# Patient Record
Sex: Male | Born: 1990 | Race: Black or African American | Hispanic: No | Marital: Married | Smoking: Never smoker
Health system: Southern US, Community
[De-identification: ages and names within clinical notes are randomized; demographics above are authoritative.]

---

## 2016-03-19 ENCOUNTER — Emergency Department
Admission: EM | Admit: 2016-03-19 | Discharge: 2016-03-19 | Disposition: A | Discharge status: Home / Self Care | Payer: Medicaid Other | Attending: Emergency Medicine | Admitting: Emergency Medicine

## 2016-03-19 ENCOUNTER — Emergency Department: Payer: Medicaid Other

## 2016-03-19 DIAGNOSIS — G4750 Parasomnia, unspecified: Secondary | ICD-10-CM | POA: Diagnosis present

## 2016-03-19 LAB — COMPREHENSIVE METABOLIC PANEL
ALBUMIN: 4.4 g/dL (ref 3.5–5.0)
ALK PHOS: 67 U/L (ref 38–126)
ALT: 16 U/L — AB (ref 17–63)
AST: 20 U/L (ref 15–41)
Anion gap: 2 — ABNORMAL LOW (ref 5–15)
BUN: 14 mg/dL (ref 6–20)
CALCIUM: 9.1 mg/dL (ref 8.9–10.3)
CHLORIDE: 106 mmol/L (ref 101–111)
CO2: 29 mmol/L (ref 22–32)
CREATININE: 1 mg/dL (ref 0.61–1.24)
GFR calc non Af Amer: >60 mL/min (ref >60)
GLUCOSE: 103 mg/dL — AB (ref 65–99)
Potassium: 3.4 mmol/L — ABNORMAL LOW (ref 3.5–5.1)
SODIUM: 137 mmol/L (ref 135–145)
Total Bilirubin: 1 mg/dL (ref 0.3–1.2)
Total Protein: 7.8 g/dL (ref 6.5–8.1)

## 2016-03-19 LAB — CBC
HCT: 42.8 % (ref 40.0–52.0)
HEMOGLOBIN: 14.5 g/dL (ref 13.0–18.0)
MCH: 31.9 pg (ref 26.0–34.0)
MCHC: 33.9 g/dL (ref 32.0–36.0)
MCV: 94 fL (ref 80.0–100.0)
PLATELETS: 212 10*3/uL (ref 150–440)
RBC: 4.55 MIL/uL (ref 4.40–5.90)
RDW: 12.7 % (ref 11.5–14.5)
WBC: 9.6 10*3/uL (ref 3.8–10.6)

## 2016-03-19 LAB — TROPONIN I: Troponin I: <0.03 ng/mL (ref <0.031)

## 2016-03-19 NOTE — ED Notes (Signed)
Reviewed d/c instructions and follow-up care with pt. Pt verbalized understanding 

## 2016-03-19 NOTE — ED Provider Notes (Signed)
[facility] Emergency Department Provider Note  ____________________________________________   I have reviewed the triage vital signs and the nursing notes.   HISTORY  Chief Complaint I can't sleep well   HPI Corey Erickson is a 25 y.o. male presents withthe above complaint. Patient states that he has difficulty falling asleep and when he does he sometimes jerks awake bilaterally. When this happens, he states he has a leaf fleeting moment of lightheadedness, he states he feels like he might pass out but that he doesn't. He feels this is attributable to stress. He states he is under more stress at work. He actually to me denies any chest pain at any other time unless he is jerking out of sleep which time he sometimes is a fleeting discomfort in his chest. He states it happen right before he came in and that's why he came to the emergency room because he started having. This happened several times a night. He has no exertional symptoms, he has no nausea or vomiting. The pain is usually described as a slight discomfort from jerking he states. Sometimes his foot will jerk. He states he has not had any actual seizures. He has not had any incontinence of bowel or bladder. He is conscious and awake when this happens. He is a happens at that moment that he is just about to fall asleep. It makes him anxious and that he is worried about trying to fall sleep thereafter. He states that he was supposed to follow up with a cardiologist for this but missed the appointment. He does not have any pain at this time. This has been going on on a daily basis for several months and he finally got medicaid & wants to get it checked out. To me, he initially denied any chest pain but was only after pressing and I discussed with them the triage note that he states that sometimes there is chest discomfort associated with this but the primary complaint that he has his own he tries to fall asleep he gets  anxious and has a spasmodic wakefulness.  No past medical history on file.  There are no active problems to display for this patient.   No past surgical history on file.  No current outpatient prescriptions on file.  Allergies Review of patient's allergies indicates no known allergies.  No family history on file.  Social History Social History  Substance Use Topics  . Smoking status: Not on file  . Smokeless tobacco: Not on file  . Alcohol Use: Not on file    Review of Systems Constitutional: No fever/chills Eyes: No visual changes. ENT: No sore throat. No stiff neck no neck pain Cardiovascular: See history of present illness Respiratory: Denies shortness of breath. Gastrointestinal:   no vomiting.  No diarrhea.  No constipation. Genitourinary: Negative for dysuria. Musculoskeletal: Negative lower extremity swelling Skin: Negative for rash. Neurological: Negative for headaches, focal weakness or numbness. 10-point ROS otherwise negative.  ____________________________________________   PHYSICAL EXAM:  VITAL SIGNS: ED Triage Vitals  Enc Vitals Group     BP 03/19/16 0050 163/101 mmHg     Pulse Rate 03/19/16 0050 83     Resp 03/19/16 0050 18     Temp 03/19/16 0050 98.1 F (36.7 C)     Temp Source 03/19/16 0050 Oral     SpO2 03/19/16 0050 98 %     Weight 03/19/16 0050 243 lb (110.224 kg)     Height 03/19/16 0050 6\' 2"  (1.88 m)  Head Cir --      Peak Flow --      Pain Score 03/19/16 0048 6     Pain Loc --      Pain Edu? --      Excl. in GC? --     Constitutional: Alert and oriented. Well appearing and in no acute distress. Eyes: Conjunctivae are normal. PERRL. EOMI. Head: Atraumatic. Nose: No congestion/rhinnorhea. Mouth/Throat: Mucous membranes are moist.  Oropharynx non-erythematous. Neck: No stridor.   Nontender with no meningismus Cardiovascular: Normal rate, regular rhythm. Grossly normal heart sounds.  Good peripheral circulation. Respiratory:  Normal respiratory effort.  No retractions. Lungs CTAB. Abdominal: Soft and nontender. No distention. No guarding no rebound Back:  There is no focal tenderness or step off there is no midline tenderness there are no lesions noted. there is no CVA tenderness Musculoskeletal: No lower extremity tenderness. No joint effusions, no DVT signs strong distal pulses no edema Neurologic:  Normal speech and language. No gross focal neurologic deficits are appreciated.  Skin:  Skin is warm, dry and intact. No rash noted. Psychiatric: Mood and affect are normal. Speech and behavior are normal.  ____________________________________________   LABS (all labs ordered are listed, but only abnormal results are displayed)  Labs Reviewed  COMPREHENSIVE METABOLIC PANEL - Abnormal; Notable for the following:    Potassium 3.4 (*)    Glucose, Bld 103 (*)    ALT 16 (*)    Anion gap 2 (*)    All other components within normal limits  CBC  TROPONIN I   ____________________________________________  EKG  I personally interpreted any EKGs ordered by me or triage Normal sinus rhythm at 76 bpm no acute ST elevation or acute ST depression normal axis ____________________________________________  RADIOLOGY  I reviewed any imaging ordered by me or triage that were performed during my shift and, if possible, patient and/or family made aware of any abnormal findings. ____________________________________________   PROCEDURES  Procedure(s) performed: None  Critical Care performed: None  ____________________________________________   INITIAL IMPRESSION / ASSESSMENT AND PLAN / ED COURSE  Pertinent labs & imaging results that were available during my care of the patient were reviewed by me and considered in my medical decision making (see chart for details).  Patient with a very unusual symptom of essentially parasomnia, he has symptoms he states to me only when he is falling asleep. He denied that happens  while he is driving. He's never had a seizure. He does not lose consciousness. Infected the opposite, when he tries to go to sleep he states he jerks awake rapidly it is disconcerting to him and he feels unwell for a few seconds. This is happened to him multiple times tonight for the last several months. He has no neurologic deficits at this time. There is no evidence of acute ongoing ACS PE or dissection. His symptoms do not suggest myocarditis endocarditis pericarditis pneumonia or pneumothorax. There is no evidence of any significant seizure activity. In any event, this is somewhat of a difficult to quantify parasomnia reaction at this time there is no evidence of acute pathology. We will have him follow closely as an outpatient for his already scheduled cardiology visit although I do not think this is likely mediated by any Cardiologic event, he does already have an appointment. We'll also have him follow-up with the primary care doctor which I think we'll be more helpful to him. I suggested that he try to reduce his stress. Patient denies cocaine, or significant caffeine  abuse or significant alcohol did advise him to exercise, extensive follow-up given and understood ____________________________________________   FINAL CLINICAL IMPRESSION(S) / ED DIAGNOSES  Final diagnoses:  None      This chart was dictated using voice recognition software.  Despite best efforts to proofread,  errors can occur which can change meaning.     Jeanmarie Plant, MD 03/19/16 [phone]

## 2016-03-19 NOTE — ED Notes (Signed)
MD McShane at bedside  

## 2016-03-19 NOTE — ED Notes (Signed)
Pt states that he has been having chest pain for the past month, states was referred to a cardiologist but missed the appointment, pt states that he started having chest tightness tonight while attempting to fall asleep, pt states each time he starts to fall asleep his chest tightens up and his body jerks him back awake. Pt denies ever having a sleep study in the past

## 2016-03-19 NOTE — Discharge Instructions (Signed)
Return to the emergency room if you have a seizure, you bite your tongue or lose consciousness, return to the emergency room if you have significant chest pain shortness of breath nausea vomiting or you feel worse in any way. Follow closely with your primary care doctor or the one to which we refer you, for further evaluation of this condition that you've had for the last few months.

## 2016-03-19 NOTE — ED Notes (Signed)
Pt reports medial chest pressure X 2 months. Pt currently rates pain/pressure at 5 out of 10.  Pt states: "Immediately when I fall asleep my body jolts me back out of my sleep, and I feel kinda lightheaded and weak. Sometimes it also happens when I'm driving". Pt reports this symptom has been going on for 1 month and has been keeping him from getting rest. Pt denies N/V, weakness, SOB, diaphoresis.

## 2016-05-31 ENCOUNTER — Encounter: Payer: Self-pay | Admitting: *Deleted

## 2016-05-31 ENCOUNTER — Emergency Department
Admission: EM | Admit: 2016-05-31 | Discharge: 2016-05-31 | Disposition: A | Discharge status: Home / Self Care | Payer: Medicaid Other | Attending: Emergency Medicine | Admitting: Emergency Medicine

## 2016-05-31 DIAGNOSIS — R42 Dizziness and giddiness: Secondary | ICD-10-CM

## 2016-05-31 LAB — CBC
HCT: 42.2 % (ref 40.0–52.0)
Hemoglobin: 14.3 g/dL (ref 13.0–18.0)
MCH: 31.5 pg (ref 26.0–34.0)
MCHC: 33.9 g/dL (ref 32.0–36.0)
MCV: 92.9 fL (ref 80.0–100.0)
PLATELETS: 182 10*3/uL (ref 150–440)
RBC: 4.54 MIL/uL (ref 4.40–5.90)
RDW: 12.6 % (ref 11.5–14.5)
WBC: 5.9 10*3/uL (ref 3.8–10.6)

## 2016-05-31 LAB — COMPREHENSIVE METABOLIC PANEL
ALBUMIN: 4.3 g/dL (ref 3.5–5.0)
ALK PHOS: 49 U/L (ref 38–126)
ALT: 16 U/L — ABNORMAL LOW (ref 17–63)
ANION GAP: 6 (ref 5–15)
AST: 23 U/L (ref 15–41)
BUN: 11 mg/dL (ref 6–20)
CALCIUM: 9.1 mg/dL (ref 8.9–10.3)
CHLORIDE: 104 mmol/L (ref 101–111)
CO2: 29 mmol/L (ref 22–32)
Creatinine, Ser: 1.03 mg/dL (ref 0.61–1.24)
GFR calc non Af Amer: >60 mL/min (ref >60)
GLUCOSE: 110 mg/dL — AB (ref 65–99)
POTASSIUM: 3.8 mmol/L (ref 3.5–5.1)
SODIUM: 139 mmol/L (ref 135–145)
Total Bilirubin: 0.9 mg/dL (ref 0.3–1.2)
Total Protein: 7.4 g/dL (ref 6.5–8.1)

## 2016-05-31 LAB — TROPONIN I: Troponin I: <0.03 ng/mL (ref <0.031)

## 2016-05-31 MED ORDER — SODIUM CHLORIDE 0.9 % IV SOLN
1000.0000 mL | Freq: Once | INTRAVENOUS | Status: AC
  Administered 2016-05-31: 1000 mL via INTRAVENOUS

## 2016-05-31 NOTE — Discharge Instructions (Signed)

## 2016-05-31 NOTE — ED Notes (Signed)
Pt complains of dizziness starting yesterday, pt reports similar episode in the past diagnosed as anxiety, pt reports" My heart fluttered yesterday"

## 2016-05-31 NOTE — ED Provider Notes (Signed)
[facility] Emergency Department Provider Note  ____________________________________________    I have reviewed the triage vital signs and the nursing notes.   HISTORY  Chief Complaint Dizziness    HPI Corey Erickson is a 25 y.o. male who presents with dizziness. Patient reports that he felt lightheaded yesterday and again today at work. He was sent home from work so he decided to come to the emergency department to be examined. He denies chest pain. He denies shortness of breath. No drug use. Does not smoke. He does report that yesterday his heart felt like it was fluttering briefly but not today. No history of arrhythmias. No recent travel. No calf pain.     History reviewed. No pertinent past medical history.  There are no active problems to display for this patient.   History reviewed. No pertinent past surgical history.  No current outpatient prescriptions on file.  Allergies Review of patient's allergies indicates no known allergies.  No family history on file.  Social History Social History  Substance Use Topics  . Smoking status: Never Smoker   . Smokeless tobacco: None  . Alcohol Use: Yes    Review of Systems  Constitutional: Negative for fever. Eyes: Negative for redness ENT: Negative for sore throat Cardiovascular: Negative for chest pain Respiratory: Negative for shortness of breath. Gastrointestinal: Negative for abdominal pain Genitourinary: Negative for dysuria. Musculoskeletal: Negative for back pain. Skin: Negative for rash. Neurological: Negative for focal weakness, No headache Psychiatric: no anxiety    ____________________________________________   PHYSICAL EXAM:  VITAL SIGNS:   ED Triage Vitals  Enc Vitals Group     BP --      Pulse --      Resp --      Temp --      Temp src --      SpO2 --      Weight --      Height --      Head Cir --      Peak Flow --      Pain Score --      Pain Loc --    Pain Edu? --      Excl. in GC? --      Constitutional: Alert and oriented. Well appearing and in no distress.  Eyes: Conjunctivae are normal. No erythema or injection ENT   Head: Normocephalic and atraumatic.   Mouth/Throat: Mucous membranes are moist. Cardiovascular: Normal rate, regular rhythm. Normal and symmetric distal pulses are present in the upper extremities. No murmurs or rubs  Respiratory: Normal respiratory effort without tachypnea nor retractions. Breath sounds are clear and equal bilaterally.  Gastrointestinal: Soft and non-tender in all quadrants. No distention. There is no CVA tenderness. Genitourinary: deferred Musculoskeletal: Nontender with normal range of motion in all extremities. No lower extremity tenderness nor edema. Neurologic:  Normal speech and language. No gross focal neurologic deficits are appreciated. Skin:  Skin is warm, dry and intact. No rash noted. Psychiatric: Mood and affect are normal. Patient exhibits appropriate insight and judgment.  ____________________________________________    LABS (pertinent positives/negatives)  Labs Reviewed  CBC  COMPREHENSIVE METABOLIC PANEL  TROPONIN I    ____________________________________________   EKG  ED ECG REPORT I, Jene Every, the attending physician, personally viewed and interpreted this ECG.  Date: 05/31/2016 EKG Time: 8:30 AM Rate: 89 Rhythm: normal sinus rhythm QRS Axis: normal Intervals: normal ST/T Wave abnormalities: normal Conduction Disturbances: none   ____________________________________________    RADIOLOGY  None  ____________________________________________   PROCEDURES  Procedure(s) performed: none  Critical Care performed:none  ____________________________________________   INITIAL IMPRESSION / ASSESSMENT AND PLAN / ED COURSE  Pertinent labs & imaging results that were available during my care of the patient were reviewed by me and considered in  my medical decision making (see chart for details).  Patient well-appearing and in no distress. He complains of mild dizziness. No history of arrhythmia. Normal heart rate today. We will check labs, give IV fluids and reevaluate.  ----------------------------------------- 9:29 AM on 05/31/2016 -----------------------------------------  Patient feels well. He is asymptomatic currently. His lab work is unremarkable. He is well-appearing and nontoxic and quite comfortable. History of present illness an exam not consistent with ACS, myocarditis, peritonitis, PE. Feel he is safe for discharge at this time with PCP follow-up.  ____________________________________________   FINAL CLINICAL IMPRESSION(S) / ED DIAGNOSES  Final diagnoses:  Dizziness          Jene Everyobert Marquis Diles, MD 05/31/16 0930

## 2016-07-05 ENCOUNTER — Emergency Department: Payer: Medicaid Other

## 2016-07-05 ENCOUNTER — Emergency Department
Admission: EM | Admit: 2016-07-05 | Discharge: 2016-07-05 | Disposition: A | Discharge status: Home / Self Care | Payer: Medicaid Other | Attending: Emergency Medicine | Admitting: Emergency Medicine

## 2016-07-05 DIAGNOSIS — R079 Chest pain, unspecified: Secondary | ICD-10-CM

## 2016-07-05 DIAGNOSIS — R0789 Other chest pain: Secondary | ICD-10-CM | POA: Diagnosis not present

## 2016-07-05 LAB — CBC
HCT: 45 % (ref 40.0–52.0)
Hemoglobin: 15.2 g/dL (ref 13.0–18.0)
MCH: 31.6 pg (ref 26.0–34.0)
MCHC: 33.7 g/dL (ref 32.0–36.0)
MCV: 93.6 fL (ref 80.0–100.0)
Platelets: 204 10*3/uL (ref 150–440)
RBC: 4.81 MIL/uL (ref 4.40–5.90)
RDW: 12.6 % (ref 11.5–14.5)
WBC: 7.4 10*3/uL (ref 3.8–10.6)

## 2016-07-05 LAB — BASIC METABOLIC PANEL
Anion gap: 7 (ref 5–15)
BUN: 13 mg/dL (ref 6–20)
CALCIUM: 9.5 mg/dL (ref 8.9–10.3)
CHLORIDE: 103 mmol/L (ref 101–111)
CO2: 29 mmol/L (ref 22–32)
CREATININE: 1.17 mg/dL (ref 0.61–1.24)
GFR calc non Af Amer: >60 mL/min (ref >60)
Glucose, Bld: 122 mg/dL — ABNORMAL HIGH (ref 65–99)
Potassium: 3.8 mmol/L (ref 3.5–5.1)
SODIUM: 139 mmol/L (ref 135–145)

## 2016-07-05 LAB — TROPONIN I

## 2016-07-05 MED ORDER — KETOROLAC TROMETHAMINE 30 MG/ML IJ SOLN
15.0000 mg | Freq: Once | INTRAMUSCULAR | Status: AC
  Administered 2016-07-05: 15 mg via INTRAVENOUS

## 2016-07-05 MED ORDER — KETOROLAC TROMETHAMINE 30 MG/ML IJ SOLN
INTRAMUSCULAR | Status: AC
  Administered 2016-07-05: 15 mg via INTRAVENOUS
  Filled 2016-07-05: qty 1

## 2016-07-05 MED ORDER — IOPAMIDOL (ISOVUE-370) INJECTION 76%
100.0000 mL | Freq: Once | INTRAVENOUS | Status: AC | PRN
  Administered 2016-07-05: 100 mL via INTRAVENOUS

## 2016-07-05 NOTE — ED Notes (Signed)
Patient transported to CT 

## 2016-07-05 NOTE — ED Notes (Signed)
Pt c/o pain in the left lower lateral rib that radiates into the left side of chest and into the left arm and jaw that started last night, denies N/V.Corey Erickson. States worse with deep breathing..Corey Erickson

## 2016-07-05 NOTE — ED Provider Notes (Signed)
Time Seen: Approximately 1410  I have reviewed the triage notes  Chief Complaint: Chest Pain   History of Present Illness: Corey Erickson is a 25 y.o. male who presents with left-sided chest discomfort with periodic radiation up into the left arm and left side of his neck. He states his pain started last night. He states is worse with deep inspiration and occasionally with movement. He denies any chest wall trauma. Patient has no pulmonary emboli were cardiovascular risk factors   History reviewed. No pertinent past medical history.  There are no active problems to display for this patient.   History reviewed. No pertinent past surgical history.  History reviewed. No pertinent past surgical history.  No current outpatient prescriptions on file.  Allergies:  Review of patient's allergies indicates no known allergies.  Family History: No family history on file.  Social History: Social History  Substance Use Topics  . Smoking status: Never Smoker   . Smokeless tobacco: None  . Alcohol Use: Yes     Review of Systems:   10 point review of systems was performed and was otherwise negative:  Constitutional: No fever Eyes: No visual disturbances ENT: No sore throat, ear pain Cardiac: Constant occasionally sharp discomfort Respiratory: No shortness of breath, wheezing, or stridor Abdomen: No abdominal pain, no vomiting, No diarrhea Endocrine: No weight loss, No night sweats Extremities: No peripheral edema, cyanosis Skin: No rashes, easy bruising Neurologic: No focal weakness, trouble with speech or swollowing Urologic: No dysuria, Hematuria, or urinary frequency Pain is also exacerbated by movement *  Physical Exam:  ED Triage Vitals  Enc Vitals Group     BP 07/05/16 1327 132/83 mmHg     Pulse Rate 07/05/16 1327 80     Resp 07/05/16 1327 18     Temp 07/05/16 1327 98.2 F (36.8 C)     Temp Source 07/05/16 1327 Oral     SpO2 07/05/16 1327 98 %     Weight  07/05/16 1327 242 lb (109.77 kg)     Height 07/05/16 1327 6\' 2"  (1.88 m)     Head Cir --      Peak Flow --      Pain Score 07/05/16 1328 8     Pain Loc --      Pain Edu? --      Excl. in GC? --     General: Awake , Alert , and Oriented times 3; GCS 15 Head: Normal cephalic , atraumatic Eyes: Pupils equal , round, reactive to light Nose/Throat: No nasal drainage, patent upper airway without erythema or exudate.  Neck: Supple, Full range of motion, No anterior adenopathy or palpable thyroid masses Lungs: Clear to ascultation without wheezes , rhonchi, or rales Heart: Regular rate, regular rhythm without murmurs , gallops , or rubs Abdomen: Soft, non tender without rebound, guarding , or rigidity; bowel sounds positive and symmetric in all 4 quadrants. No organomegaly .        Extremities: 2 plus symmetric pulses. No edema, clubbing or cyanosis Neurologic: normal ambulation, Motor symmetric without deficits, sensory intact Skin: warm, dry, no rashes Mild reproducible nature to his pain with movement at the left shoulder and left upper extremity  Labs:   All laboratory work was reviewed including any pertinent negatives or positives listed below:  Labs Reviewed  BASIC METABOLIC PANEL - Abnormal; Notable for the following:    Glucose, Bld 122 (*)    All other components within normal limits  CBC  TROPONIN I  Laboratory work was reviewed and showed no clinically significant abnormalities.   EKG:  ED ECG REPORT I, Jennye Moccasin, the attending physician, personally viewed and interpreted this ECG.  Date: 07/05/2016 EKG Time: *1334 Rate: 83 Rhythm: normal sinus rhythm QRS Axis: normal Intervals: normal ST/T Wave abnormalities: normal Conduction Disturbances: none Narrative Interpretation: unremarkable No acute ischemic changes   Radiology:   CT ANGIO CHEST PE W OR WO CONTRAST (Final result) Result time: 07/05/16 15:53:03   Final result by Rad Results In Interface  (07/05/16 15:53:03)   Narrative:   CLINICAL DATA: Chest pain radiating into the left upper extremity and jaw.  EXAM: CT ANGIOGRAPHY CHEST WITH CONTRAST  TECHNIQUE: Multidetector CT imaging of the chest was performed using the standard protocol during bolus administration of intravenous contrast. Multiplanar CT image reconstructions and MIPs were obtained to evaluate the vascular anatomy.  CONTRAST: 100 cc Isovue 370 IV.  COMPARISON: No prior chest CT. Chest radiograph from earlier today.  FINDINGS: Mediastinum/Nodes: The study is high quality for the evaluation of pulmonary embolism. There are no filling defects in the central, lobar, segmental or subsegmental pulmonary artery branches to suggest acute pulmonary embolism. Great vessels are normal in course and caliber. No evidence of aortic dissection. Normal heart size. No significant pericardial fluid/thickening. No discrete thyroid nodules. Unremarkable esophagus. No pathologically enlarged axillary, mediastinal or hilar lymph nodes.  Lungs/Pleura: No pneumothorax. No pleural effusion. No acute consolidative airspace disease, significant pulmonary nodules or lung masses.  Upper abdomen: Unremarkable.  Musculoskeletal: No aggressive appearing focal osseous lesions. Mild gynecomastia asymmetric to the right.  Review of the MIP images confirms the above findings.  IMPRESSION: 1. No pulmonary embolism. 2. No acute pulmonary disease . 3. Mild gynecomastia asymmetric to the right.   Electronically Signed By: Delbert Phenix M.D. On: 07/05/2016 15:53          DG Chest 2 View (Final result) Result time: 07/05/16 13:47:45   Final result by Rad Results In Interface (07/05/16 13:47:45)   Narrative:   CLINICAL DATA: Pain in the left lower lateral ribs radiating to the left side of the chest.  EXAM: CHEST 2 VIEW  COMPARISON: 03/19/2016  FINDINGS: The heart size and mediastinal contours are within  normal limits. Both lungs are clear. The visualized skeletal structures are unremarkable.  IMPRESSION: No active cardiopulmonary disease.   Electronically Signed By: Elige Ko On: 07/05/2016 13:47              I personally reviewed the radiologic studies   2 ED Course: * Differential includes all life-threatening causes for chest pain. This includes but is not exclusive to acute coronary syndrome, aortic dissection, pulmonary embolism, cardiac tamponade, community-acquired pneumonia, pericarditis, musculoskeletal chest wall pain, etc. Given the patient's current clinical presentation, objective findings, and risk factors, I felt this was unlikely to be a life-threatening cause for his chest pain such as acute coronary syndrome, aortic dissection, and pulmonary embolism. Patient received an extensive evaluation doesn't appear to have any life-threatening etiologies. Given the pains occasionally worse with movement and deep inspiration I felt it may be musculoskeletal advised the patient to take over-the-counter ibuprofen.    Assessment: * Acute unspecified chest pain  Final Clinical Impression:  * Final diagnoses:  Chest pain of uncertain etiology     Plan: * Outpatient Patient was advised to return immediately if condition worsens. Patient was advised to follow up with their primary care physician or other specialized physicians involved in their outpatient care. The patient  and/or family member/power of attorney had laboratory results reviewed at the bedside. All questions and concerns were addressed and appropriate discharge instructions were distributed by the nursing staff.             Jennye MoccasinBrian S Coleson Kant, MD 07/05/16 1640

## 2016-07-05 NOTE — Discharge Instructions (Signed)
Nonspecific Chest Pain °It is often hard to find the cause of chest pain. There is always a chance that your pain could be related to something serious, such as a heart attack or a blood clot in your lungs. Chest pain can also be caused by conditions that are not life-threatening. If you have chest pain, it is very important to follow up with your doctor. ° °HOME CARE °· If you were prescribed an antibiotic medicine, finish it all even if you start to feel better. °· Avoid any activities that cause chest pain. °· Do not use any tobacco products, including cigarettes, chewing tobacco, or electronic cigarettes. If you need help quitting, ask your doctor. °· Do not drink alcohol. °· Take medicines only as told by your doctor. °· Keep all follow-up visits as told by your doctor. This is important. This includes any further testing if your chest pain does not go away. °· Your doctor may tell you to keep your head raised (elevated) while you sleep. °· Make lifestyle changes as told by your doctor. These may include: °¨ Getting regular exercise. Ask your doctor to suggest some activities that are safe for you. °¨ Eating a heart-healthy diet. Your doctor or a diet specialist (dietitian) can help you to learn healthy eating options. °¨ Maintaining a healthy weight. °¨ Managing diabetes, if necessary. °¨ Reducing stress. °GET HELP IF: °· Your chest pain does not go away, even after treatment. °· You have a rash with blisters on your chest. °· You have a fever. °GET HELP RIGHT AWAY IF: °· Your chest pain is worse. °· You have an increasing cough, or you cough up blood. °· You have severe belly (abdominal) pain. °· You feel extremely weak. °· You pass out (faint). °· You have chills. °· You have sudden, unexplained chest discomfort. °· You have sudden, unexplained discomfort in your arms, back, neck, or jaw. °· You have shortness of breath at any time. °· You suddenly start to sweat, or your skin gets clammy. °· You feel  nauseous. °· You vomit. °· You suddenly feel light-headed or dizzy. °· Your heart begins to beat quickly, or it feels like it is skipping beats. °These symptoms may be an emergency. Do not wait to see if the symptoms will go away. Get medical help right away. Call your local emergency services (911 in the [place] Do not drive yourself to the hospital. °  °This information is not intended to replace advice given to you by your health care provider. Make sure you discuss any questions you have with your health care provider. °  °Document Released: 06/04/2008 Document Revised: 01/07/2015 Document Reviewed: 07/23/2014 °Elsevier Interactive Patient Education ©2016 [employer] ° °Please return immediately if condition worsens. Please contact her primary physician or the physician you were given for referral. If you have any specialist physicians involved in her treatment and plan please also contact them. Thank you for using [facility]. ° °

## 2016-09-17 ENCOUNTER — Emergency Department
Admission: EM | Admit: 2016-09-17 | Discharge: 2016-09-17 | Disposition: A | Discharge status: Home / Self Care | Payer: Medicaid Other | Attending: Emergency Medicine | Admitting: Emergency Medicine

## 2016-09-17 ENCOUNTER — Emergency Department: Payer: Medicaid Other

## 2016-09-17 DIAGNOSIS — J069 Acute upper respiratory infection, unspecified: Secondary | ICD-10-CM | POA: Insufficient documentation

## 2016-09-17 DIAGNOSIS — R0981 Nasal congestion: Secondary | ICD-10-CM

## 2016-09-17 MED ORDER — FLUTICASONE PROPIONATE 50 MCG/ACT NA SUSP: 1.0000 | Freq: Every day | NASAL | 0 refills | Status: AC

## 2016-09-17 MED ORDER — BENZONATATE 100 MG PO CAPS
100.0000 mg | ORAL_CAPSULE | Freq: Once | ORAL | Status: AC
  Administered 2016-09-17: 100 mg via ORAL
  Filled 2016-09-17: qty 1

## 2016-09-17 MED ORDER — KETOROLAC TROMETHAMINE 60 MG/2ML IM SOLN
60.0000 mg | Freq: Once | INTRAMUSCULAR | Status: AC
  Administered 2016-09-17: 60 mg via INTRAMUSCULAR
  Filled 2016-09-17: qty 2

## 2016-09-17 MED ORDER — BENZONATATE 100 MG PO CAPS: 100.0000 mg | ORAL_CAPSULE | Freq: Four times a day (QID) | ORAL | 0 refills | Status: AC | PRN

## 2016-09-17 NOTE — ED Triage Notes (Signed)
Pt reports cough, congestion, and chills for the past 3 days, states that when he coughs he has pain in his chest, pt reports taking otc robitussin

## 2016-09-17 NOTE — ED Notes (Signed)
Patient transported to X-ray 

## 2016-09-17 NOTE — ED Provider Notes (Signed)
[facility] Emergency Department Provider Note   ____________________________________________   First MD Initiated Contact with Patient 09/17/16 [phone]     (approximate)  I have reviewed the triage vital signs and the nursing notes.   HISTORY  Chief Complaint Cough    HPI Corey Erickson is a 25 y.o. male who comes into the hospital today with a cough for the past 2 days. He reports that he has pain in his chest whenever he coughs and when he takes a deep breath. The patient also has a sore throat and some nasal congestion. He has had some muscle aches and chills and felt feverish but has not had a fever. The patient tried taking Robitussin but it did not help. The patient has not had any sick contacts. The patient is concerned because he is having the symptoms anyone to know what was going on. He also has a 3781-month-old baby at home and wants to make sure she doesn't get sick. He reports that his body aches is a 6 out of 10 in intensity. He has noted pain in his chest and mostly coughs and he reports that that is than an 8 out of 10 but currently it is 0. The patient does have a primary care physician at home. He has not tried taking any Tylenol ibuprofen at home.The patient has no shortness of breath.   No past medical history   There are no active problems to display for this patient.   No past surgical history  Prior to Admission medications   Medication Sig Start Date End Date Taking? Authorizing Provider  benzonatate (TESSALON PERLES) 100 MG capsule Take 1 capsule (100 mg total) by mouth every 6 (six) hours as needed for cough. 09/17/16   Rebecka ApleyAllison P Somya Jauregui, MD  fluticasone (FLONASE) 50 MCG/ACT nasal spray Place 1 spray into both nostrils daily. 09/17/16 09/24/16  Rebecka ApleyAllison P Edgardo Petrenko, MD    Allergies Review of patient's allergies indicates no known allergies.  No family history on file.  Social History Social History  Substance Use Topics  . Smoking  status: Never Smoker  . Smokeless tobacco: Not on file  . Alcohol use Yes    Review of Systems Constitutional: No fever/chills Eyes: No visual changes. ENT:  sore throat. Cardiovascular:  chest pain. Respiratory: Cough Gastrointestinal: No abdominal pain.  No nausea, no vomiting.  No diarrhea.  No constipation. Genitourinary: Negative for dysuria. Musculoskeletal: Negative for back pain. Skin: Negative for rash. Neurological: Negative for headaches, focal weakness or numbness.  10-point ROS otherwise negative.  ____________________________________________   PHYSICAL EXAM:  VITAL SIGNS: ED Triage Vitals [09/17/16 0136]  Enc Vitals Group     BP (!) 151/94     Pulse Rate 82     Resp 18     Temp 98.2 F (36.8 C)     Temp Source Oral     SpO2 100 %     Weight 240 lb (108.9 kg)     Height 6\' 2"  (1.88 m)     Head Circumference      Peak Flow      Pain Score 8     Pain Loc      Pain Edu?      Excl. in GC?     Constitutional: Alert and oriented. Well appearing and in Mild distress. Eyes: Conjunctivae are normal. PERRL. EOMI. Head: Atraumatic. Nose: Nasal congestion Mouth/Throat: Mucous membranes are moist.  Oropharynx non-erythematous. Cardiovascular: Normal rate, regular rhythm. Grossly normal heart  sounds.  Good peripheral circulation. Respiratory: Normal respiratory effort.  No retractions. Lungs CTAB. Gastrointestinal: Soft and nontender. No distention. Positive bowel sounds Musculoskeletal: No lower extremity tenderness nor edema.   Neurologic:  Normal speech and language.  Skin:  Skin is warm, dry and intact.  Psychiatric: Mood and affect are normal.   ____________________________________________   LABS (all labs ordered are listed, but only abnormal results are displayed)  Labs Reviewed - No data to display ____________________________________________  EKG  none ____________________________________________  RADIOLOGY  Chest  x-ray ____________________________________________   PROCEDURES  Procedure(s) performed: None  Procedures  Critical Care performed: No  ____________________________________________   INITIAL IMPRESSION / ASSESSMENT AND PLAN / ED COURSE  Pertinent labs & imaging results that were available during my care of the patient were reviewed by me and considered in my medical decision making (see chart for details).  This is a 25 year old male who comes into the hospital today with a cough and some congestion as well as some pain in his chest when he coughs. The patient's symptoms are consistent with an upper respiratory infection. I did give the patient a shot of Toradol and I did send the patient for a chest x-ray. He is not having any documented fevers but I will ensure that he does not have any pneumonias.  Clinical Course  Value Comment By Time  DG Chest 2 View No active cardiopulmonary disease Rebecka Apley, MD 09/18 0710   The patient's chest x-ray does not have pneumonia. The patient will be discharged to home with some benzonatate for his cough as well as some Flonase for his nasal congestion. The patient should follow-up with his primary care physician. Patient has no further concerns.  ____________________________________________   FINAL CLINICAL IMPRESSION(S) / ED DIAGNOSES  Final diagnoses:  URI (upper respiratory infection)  Nasal congestion      NEW MEDICATIONS STARTED DURING THIS VISIT:  Discharge Medication List as of 09/17/2016  7:16 AM    START taking these medications   Details  benzonatate (TESSALON PERLES) 100 MG capsule Take 1 capsule (100 mg total) by mouth every 6 (six) hours as needed for cough., Starting Mon 09/17/2016, Print    fluticasone (FLONASE) 50 MCG/ACT nasal spray Place 1 spray into both nostrils daily., Starting Mon 09/17/2016, Until Mon 09/24/2016, Print         Note:  This document was prepared using Dragon voice recognition software  and may include unintentional dictation errors.    Rebecka Apley, MD 09/17/16 [phone]

## 2016-09-20 ENCOUNTER — Encounter: Payer: Self-pay | Admitting: Emergency Medicine

## 2016-09-20 DIAGNOSIS — J069 Acute upper respiratory infection, unspecified: Secondary | ICD-10-CM | POA: Insufficient documentation

## 2016-09-20 DIAGNOSIS — R05 Cough: Secondary | ICD-10-CM | POA: Diagnosis present

## 2016-09-20 NOTE — ED Triage Notes (Signed)
Pt to triage via w/c with no distress noted, brought in by EMS; seen 18th for URI; st symptoms persists with sinus congestion and cough, prod at times with yellow sputum

## 2016-09-21 ENCOUNTER — Emergency Department
Admission: EM | Admit: 2016-09-21 | Discharge: 2016-09-21 | Disposition: A | Discharge status: Home / Self Care | Payer: Medicaid Other | Attending: Emergency Medicine | Admitting: Emergency Medicine

## 2016-09-21 DIAGNOSIS — J069 Acute upper respiratory infection, unspecified: Secondary | ICD-10-CM

## 2016-09-21 MED ORDER — ALBUTEROL SULFATE HFA 108 (90 BASE) MCG/ACT IN AERS: 2.0000 | INHALATION_SPRAY | Freq: Four times a day (QID) | RESPIRATORY_TRACT | 0 refills | Status: AC | PRN

## 2016-09-21 NOTE — Discharge Instructions (Signed)
Please seek medical attention for any high fevers, chest pain, shortness of breath, change in behavior, persistent vomiting, bloody stool or any other new or concerning symptoms.  

## 2016-09-21 NOTE — ED Provider Notes (Signed)
[facility] Emergency Department Provider Note  ____________________________________________   I have reviewed the triage vital signs and the nursing notes.   HISTORY  Chief Complaint Cough, Shortness of breath  History limited by: Not Limited   HPI Lenville Hibberd is a 25 y.o. male who presents to the emergency department tonight via ambulance because of continued cough and shortness of breath. The patient was seen in the emergency department three days ago and diagnosed with a viral URI. Patient had CXR done at that time without any concerning findings. The patient was given Jerilynn Som, which he has been using. Did have some fevers when the illness started but not recently.     History reviewed. No pertinent past medical history.  There are no active problems to display for this patient.   History reviewed. No pertinent surgical history.  Prior to Admission medications   Medication Sig Start Date End Date Taking? Authorizing Provider  benzonatate (TESSALON PERLES) 100 MG capsule Take 1 capsule (100 mg total) by mouth every 6 (six) hours as needed for cough. 09/17/16   Rebecka Apley, MD  fluticasone (FLONASE) 50 MCG/ACT nasal spray Place 1 spray into both nostrils daily. 09/17/16 09/24/16  Rebecka Apley, MD    Allergies Review of patient's allergies indicates no known allergies.  No family history on file.  Social History Social History  Substance Use Topics  . Smoking status: Never Smoker  . Smokeless tobacco: Never Used  . Alcohol use Yes    Review of Systems  Constitutional: Negative for fever. Cardiovascular: Negative for chest pain. Respiratory: Positive for cough and shortness of breath. Gastrointestinal: Negative for abdominal pain, vomiting and diarrhea. Genitourinary: Negative for dysuria. Musculoskeletal: Negative for back pain. Skin: Negative for rash. Neurological: Negative for headaches, focal weakness or  numbness.   10-point ROS otherwise negative.  ____________________________________________   PHYSICAL EXAM:  VITAL SIGNS: ED Triage Vitals  Enc Vitals Group     BP 09/20/16 2303 (!) 146/90     Pulse Rate 09/20/16 2303 100     Resp 09/20/16 2303 20     Temp 09/20/16 2303 98.1 F (36.7 C)     Temp Source 09/20/16 2303 Oral     SpO2 09/20/16 2303 97 %     Weight 09/20/16 2302 240 lb (108.9 kg)     Height 09/20/16 2302 6\' 2"  (1.88 m)   Constitutional: Alert and oriented. Well appearing and in no distress. Eyes: Conjunctivae are normal. Normal extraocular movements. ENT   Head: Normocephalic and atraumatic.   Nose: No congestion/rhinnorhea.   Mouth/Throat: Mucous membranes are moist.   Neck: No stridor. Hematological/Lymphatic/Immunilogical: No cervical lymphadenopathy. Cardiovascular: Normal rate, regular rhythm.  No murmurs, rubs, or gallops. Respiratory: Normal respiratory effort without tachypnea nor retractions. Breath sounds are clear and equal bilaterally. No wheezes/rales/rhonchi. Occasional dry cough during the exam.  Gastrointestinal: Soft and nontender. No distention.  Genitourinary: Deferred Musculoskeletal: Normal range of motion in all extremities. No lower extremity edema. Neurologic:  Normal speech and language. No gross focal neurologic deficits are appreciated.  Skin:  Skin is warm, dry and intact. No rash noted. Psychiatric: Mood and affect are normal. Speech and behavior are normal. Patient exhibits appropriate insight and judgment.  ____________________________________________    LABS (pertinent positives/negatives)  None  ____________________________________________   EKG  None  ____________________________________________    RADIOLOGY  None  ____________________________________________   PROCEDURES  Procedures  ____________________________________________   INITIAL IMPRESSION / ASSESSMENT AND PLAN / ED  COURSE  Pertinent labs & imaging results that were available during my care of the patient were reviewed by me and considered in my medical decision making (see chart for details).  Patient presents to the emergency department today with continued cough and shortness of breath. Lungs clear today, no fever, do not feel repeat CXR warranted. Will give patient prescription for albuterol inhaler to help symptoms. Discussed expected course of URI.   ____________________________________________   FINAL CLINICAL IMPRESSION(S) / ED DIAGNOSES  Final diagnoses:  URI (upper respiratory infection)     Note: This dictation was prepared with Dragon dictation. Any transcriptional errors that result from this process are unintentional    Phineas SemenGraydon Nekita Pita, MD 09/21/16 [phone]

## 2016-11-23 ENCOUNTER — Encounter (HOSPITAL_COMMUNITY): Payer: Self-pay

## 2016-11-23 ENCOUNTER — Ambulatory Visit (HOSPITAL_COMMUNITY)
Admission: EM | Admit: 2016-11-23 | Discharge: 2016-11-23 | Disposition: A | Discharge status: Home / Self Care | Payer: Medicaid Other | Attending: Emergency Medicine | Admitting: Emergency Medicine

## 2016-11-23 DIAGNOSIS — M76891 Other specified enthesopathies of right lower limb, excluding foot: Secondary | ICD-10-CM | POA: Diagnosis not present

## 2016-11-23 DIAGNOSIS — M7661 Achilles tendinitis, right leg: Secondary | ICD-10-CM

## 2016-11-23 DIAGNOSIS — S76111A Strain of right quadriceps muscle, fascia and tendon, initial encounter: Secondary | ICD-10-CM

## 2016-11-23 DIAGNOSIS — S86811A Strain of other muscle(s) and tendon(s) at lower leg level, right leg, initial encounter: Secondary | ICD-10-CM

## 2016-11-23 DIAGNOSIS — M7651 Patellar tendinitis, right knee: Secondary | ICD-10-CM | POA: Diagnosis not present

## 2016-11-23 MED ORDER — NAPROXEN 375 MG PO TABS: 375.0000 mg | ORAL_TABLET | Freq: Two times a day (BID) | ORAL | 0 refills | Status: DC

## 2016-11-23 NOTE — ED Provider Notes (Signed)
CSN: [id number]     Arrival date & time 11/23/16  1209 History   First MD Initiated Contact with Patient 11/23/16 1503     Chief Complaint  Patient presents with  . 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 He states he was doing this quickly approximate 2 weeks ago and felt some discomfort in his right lower extremity. He has continued to work which requires the above duties including rapid walking and walking frequently during the day. He is currently having pain in the quadriceps muscles, pain over the medial and lateral aspect of the knee, the anterior knee, lesser to the calf and more so over the Achilles tendon. The pain and discomfort is worse when performing his duties at work, walking briskly, lifting and pulling. Denies blunt trauma or fall.      History reviewed. No pertinent past medical history. History reviewed. No pertinent surgical history. No family history on file. Social History  Substance Use Topics  . Smoking status: Never Smoker  . Smokeless tobacco: Never Used  . Alcohol use Yes    Review of Systems  Constitutional: Negative.   Respiratory: Negative.   Gastrointestinal: Negative.   Genitourinary: Negative.   Musculoskeletal: Positive for gait problem and myalgias. Negative for back pain, neck pain and neck stiffness.       As per HPI  Skin: Negative.   Neurological: Negative for dizziness, weakness, numbness and headaches.  All other systems reviewed and are negative.   Allergies  Patient has no known allergies.  Home Medications   Prior to Admission medications   Medication Sig Start Date End Date Taking? Authorizing Provider  albuterol (PROVENTIL HFA;VENTOLIN HFA) 108 (90 Base) MCG/ACT inhaler Inhale 2 puffs into the lungs  every 6 (six) hours as needed for wheezing or shortness of breath. 09/21/16   Phineas SemenGraydon Goodman, MD  benzonatate (TESSALON PERLES) 100 MG capsule Take 1 capsule (100 mg total) by mouth every 6 (six) hours as needed for cough. 09/17/16   Rebecka ApleyAllison P Webster, MD  fluticasone (FLONASE) 50 MCG/ACT nasal spray Place 1 spray into both nostrils daily. 09/17/16 09/24/16  Rebecka ApleyAllison P Webster, MD  naproxen (NAPROSYN) 375 MG tablet Take 1 tablet (375 mg total) by mouth 2 (two) times daily. 11/23/16   Hayden Rasmussenavid Icie Kuznicki, NP   Meds Ordered and Administered this Visit  Medications - No data to display  BP 137/77 (BP Location: Left Arm)   Pulse 71   Temp 98 F (36.7 C) (Oral)   Resp 16   SpO2 100%  No data found.   Physical Exam  Constitutional: He is oriented to person, place, and time. He appears well-developed and well-nourished.  HENT:  Head: Normocephalic and atraumatic.  Eyes: EOM are normal. Left eye exhibits no discharge.  Neck: Normal range of motion. Neck supple.  Pulmonary/Chest: Effort normal.  Musculoskeletal:  There is mild tenderness over the right quadricep muscle culture. His also soreness of these muscles 1 extending the knee. There is tenderness over the medial and lateral femoral condyles but no swelling or discoloration. Mild tenderness over the anterior patellar tendon and without swelling. There is no deformity of the knee. It was trace for range of motion of the knee. No laxity. Negative drawer. There is tenderness over the Achilles tendon. The tendon is smooth and intact there are no dimples or  disruption in the contour of the tendon. Demonstrates plantar flexion and dorsiflexion against resistance. There is mild soreness to the gastric anemia's muscle however no swelling, tension or deformity. Distal neurovascular motor sensory grossly intact. Pedal pulse 2+. Normal warmth and color. No edema.  Neurological: He is alert and oriented to person, place, and time. No cranial nerve deficit.  Skin:  Skin is warm and dry.  Psychiatric: He has a normal mood and affect.    Urgent Care Course   Clinical Course     Procedures (including critical care time)  Labs Review Labs Reviewed - No data to display  Imaging Review No results found.   Visual Acuity Review  Right Eye Distance:   Left Eye Distance:   Bilateral Distance:    Right Eye Near:   Left Eye Near:    Bilateral Near:         MDM   1. Patellar tendinitis of right knee   2. Quadriceps muscle strain, right, initial encounter   3. Tendinitis of right knee   4. Achilles tendinitis of right lower extremity   5. Strain of calf muscle, right, initial encounter    Wear the knee sleeve while working. Also wear the ASO ankle splint to help provide support to the ankle, Achilles tendon and knee. Take short steps while walking. Out of work for the next couple days then limited work for the remainder of the week. Apply cold compresses to the points of tenderness and pain. Limit activity is much as possible to limit injury. Read instructions to assist in care modalities as well as rehabilitation so that the muscles, tendons will get stronger and feel better. Use ice rather than heat at this time. Meds ordered this encounter  Medications  . naproxen (NAPROSYN) 375 MG tablet    Sig: Take 1 tablet (375 mg total) by mouth 2 (two) times daily.    Dispense:  20 tablet    Refill:  0    Order Specific Question:   Supervising Provider    Answer:   Micheline ChapmanHONIG, ERIN J [[id number]]       Hayden Rasmussenavid Oluwaseyi Tull, NP 11/23/16 1537

## 2016-11-23 NOTE — Discharge Instructions (Signed)
Wear the knee sleeve while working. Also wear the ASO ankle splint to help provide support to the ankle, Achilles tendon and knee. Take short steps while walking. Out of work for the next couple days then limited work for the remainder of the week. Apply cold compresses to the points of tenderness and pain. Limit activity is much as possible to limit injury. Read instructions to assist in care modalities as well as rehabilitation so that the muscles, tendons will get stronger and feel better. Use ice rather than heat at this time.

## 2016-11-23 NOTE — ED Triage Notes (Signed)
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 But starting at his toes and radiating up this leg stopping at his thigh. Has been going on for 2 weeks. Did soak it in epsom salt without relief.

## 2017-01-17 ENCOUNTER — Encounter: Payer: Self-pay | Admitting: *Deleted

## 2017-01-17 ENCOUNTER — Emergency Department
Admission: EM | Admit: 2017-01-17 | Discharge: 2017-01-17 | Disposition: A | Discharge status: Home / Self Care | Payer: Medicaid Other | Attending: Emergency Medicine | Admitting: Emergency Medicine

## 2017-01-17 ENCOUNTER — Emergency Department: Payer: Medicaid Other

## 2017-01-17 DIAGNOSIS — Y999 Unspecified external cause status: Secondary | ICD-10-CM | POA: Insufficient documentation

## 2017-01-17 DIAGNOSIS — X58XXXA Exposure to other specified factors, initial encounter: Secondary | ICD-10-CM | POA: Diagnosis not present

## 2017-01-17 DIAGNOSIS — Y929 Unspecified place or not applicable: Secondary | ICD-10-CM | POA: Insufficient documentation

## 2017-01-17 DIAGNOSIS — S8391XA Sprain of unspecified site of right knee, initial encounter: Secondary | ICD-10-CM | POA: Diagnosis not present

## 2017-01-17 DIAGNOSIS — Y939 Activity, unspecified: Secondary | ICD-10-CM | POA: Diagnosis not present

## 2017-01-17 DIAGNOSIS — S8991XA Unspecified injury of right lower leg, initial encounter: Secondary | ICD-10-CM | POA: Diagnosis present

## 2017-01-17 DIAGNOSIS — Z79899 Other long term (current) drug therapy: Secondary | ICD-10-CM | POA: Diagnosis not present

## 2017-01-17 MED ORDER — NAPROXEN 500 MG PO TABS: 500.0000 mg | ORAL_TABLET | Freq: Two times a day (BID) | ORAL | 0 refills | Status: AC

## 2017-01-17 NOTE — ED Provider Notes (Signed)
[facility] Emergency Department Provider Note  ____________________________________________  Time seen: Approximately 10:03 AM  I have reviewed the triage vital signs and the nursing notes.   HISTORY  Chief Complaint Knee Pain    HPI Corey Erickson is a 26 y.o. male , NAD, presents to the emergency department for evaluation of right knee pain. Patient states he has had pain about his right knee for approximately 2 months. States the pain worsens after he completes shifts at work. Has not noted any redness, abnormal was a swelling to the area. Denies any injury, trauma or fall which incited the pain. States he has occasionally taken over-the-counter medications without alleviation of pain. Has not completed any other supportive care. Denies any numbness, weakness or tingling of the extremity. No lower back or hip pain.   History reviewed. No pertinent past medical history.  There are no active problems to display for this patient.   History reviewed. No pertinent surgical history.  Prior to Admission medications   Medication Sig Start Date End Date Taking? Authorizing Provider  albuterol (PROVENTIL HFA;VENTOLIN HFA) 108 (90 Base) MCG/ACT inhaler Inhale 2 puffs into the lungs every 6 (six) hours as needed for wheezing or shortness of breath. 09/21/16   Phineas Semen, MD  benzonatate (TESSALON PERLES) 100 MG capsule Take 1 capsule (100 mg total) by mouth every 6 (six) hours as needed for cough. 09/17/16   Rebecka Apley, MD  fluticasone (FLONASE) 50 MCG/ACT nasal spray Place 1 spray into both nostrils daily. 09/17/16 09/24/16  Rebecka Apley, MD  naproxen (NAPROSYN) 500 MG tablet Take 1 tablet (500 mg total) by mouth 2 (two) times daily with a meal. 01/17/17   Aniston Christman L Tocara Mennen, PA-C    Allergies Patient has no known allergies.  History reviewed. No pertinent family history.  Social History Social History  Substance Use Topics  . Smoking status: Never Smoker   . Smokeless tobacco: Never Used  . Alcohol use Yes     Review of Systems  Constitutional: No fever/chills Musculoskeletal: Positive right knee pain. Negative for back, hip pain.  Skin: Negative for rash, redness, swelling, bruising, skin sores. Neurological: Negative for numbness, wheezes, tingling.  ____________________________________________   PHYSICAL EXAM:  VITAL SIGNS: ED Triage Vitals [01/17/17 0950]  Enc Vitals Group     BP 130/90     Pulse Rate 71     Resp 18     Temp 98.4 F (36.9 C)     Temp Source Oral     SpO2 97 %     Weight 227 lb (103 kg)     Height 6\' 2"  (1.88 m)     Head Circumference      Peak Flow      Pain Score 8     Pain Loc      Pain Edu?      Excl. in GC?      Constitutional: Alert and oriented. Well appearing and in no acute distress. Eyes: Conjunctivae are normal.  Head: Atraumatic. Cardiovascular:  Good peripheral circulation with 2+ pulses noted in the right lower extremity. Respiratory: Normal respiratory effort without tachypnea or retractions.  Musculoskeletal: Tenderness to palpation over the lateral, anterior right knee without crepitus, bone abnormality or effusion. No pain with patellofemoral grinding. No laxity with anterior or posterior drawer. No laxity with varus or valgus stress. Negative McMurray's. No lower extremity tenderness nor edema.  No joint effusions. Neurologic:  Normal speech and language. No gross focal neurologic deficits  are appreciated. Facial light touch grossly intact by the right lower extremity. Skin:  Skin is warm, dry and intact. No rash, redness, swelling, abnormal warmth, bruising, skin sores noted. Psychiatric: Mood and affect are normal. Speech and behavior are normal. Patient exhibits appropriate insight and judgement.   ____________________________________________    LABS  None ____________________________________________  EKG  None ____________________________________________  RADIOLOGY I, Branden Vine L Emaleigh Guimond, personally viewed and evaluaHope Pigeonted these images (plain radiographs) as part of my medical decision making, as well as reviewing the written report by the radiologist.  Dg Knee 2 Views Right  Result Date: 01/17/2017 CLINICAL DATA:  Knee pain for 2 months EXAM: RIGHT KNEE - 1-2 VIEW COMPARISON:  None. FINDINGS: No evidence of fracture, dislocation, or joint effusion. No evidence of arthropathy or other focal bone abnormality. Soft tissues are unremarkable. IMPRESSION: Negative. Electronically Signed   By: Elige KoHetal  Patel   On: 01/17/2017 10:10    ____________________________________________    PROCEDURES  Procedure(s) performed: None   Procedures   Medications - No data to display   ____________________________________________   INITIAL IMPRESSION / ASSESSMENT AND PLAN / ED COURSE  Pertinent labs & imaging results that were available during my care of the patient were reviewed by me and considered in my medical decision making (see chart for details).     Patient's diagnosis is consistent with sprain of right knee. Patient was placed in an Ace wrap and encouraged to purchase a neoprene knee sleeve over-the-counter. Patient will be discharged home with prescriptions for naproxen to take as directed. Patient is to follow up with Dr. Rosita KeaMenz in orthopedics if symptoms persist past this treatment course. Patient is given ED precautions to return to the ED for any worsening or new symptoms.     ____________________________________________  FINAL CLINICAL IMPRESSION(S) / ED DIAGNOSES  Final diagnoses:  Sprain of right knee, unspecified ligament, initial encounter      NEW MEDICATIONS STARTED DURING THIS VISIT:  Discharge Medication List as of 01/17/2017 10:33 AM           Hope PigeonJami L Alixis Harmon, PA-C 01/17/17 1438    Sharyn CreamerMark Quale,  MD 01/17/17 1558

## 2017-01-17 NOTE — ED Triage Notes (Signed)
Pt states right knee pain for 2 months, denies any known injury, ambulatory

## 2018-03-19 IMAGING — CT CT ANGIO CHEST
2 of 6 series · 18 of 46 positions shown · IV contrast (APPLIED)
Comparison: No prior chest CT. Chest radiograph from earlier today.

CLINICAL DATA: Chest pain radiating into the left upper extremity
and jaw.

EXAM:
CT ANGIOGRAPHY CHEST WITH CONTRAST
TECHNIQUE: Multidetector CT imaging of the chest was performed using the
standard protocol during bolus administration of intravenous
contrast. Multiplanar CT image reconstructions and MIPs were
obtained to evaluate the vascular anatomy.
CONTRAST:  100 cc Isovue 370 IV.

[Series 5: pe thins 1.5 · axial · 0.68mm/px · z∈[-675,-399]mm · 15 of 258 slices shown]
[im 14/258  lung]
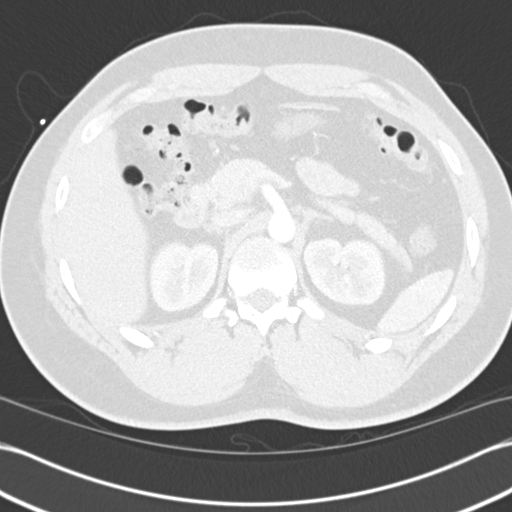
[im 28/258  soft-tissue]
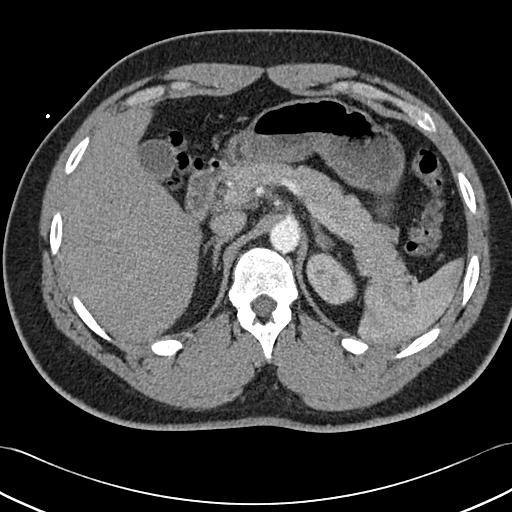
[im 55/258  lung]
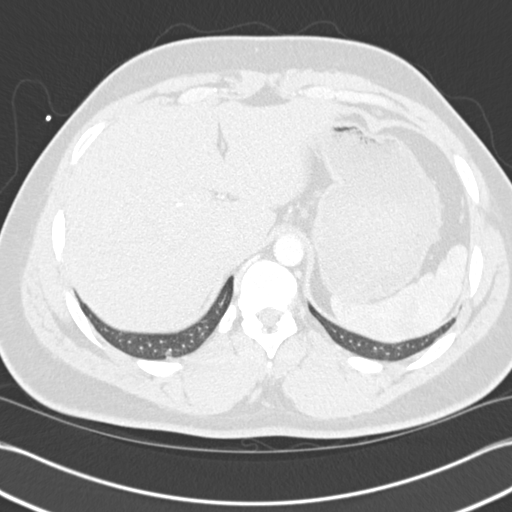
[im 68/258  soft-tissue]
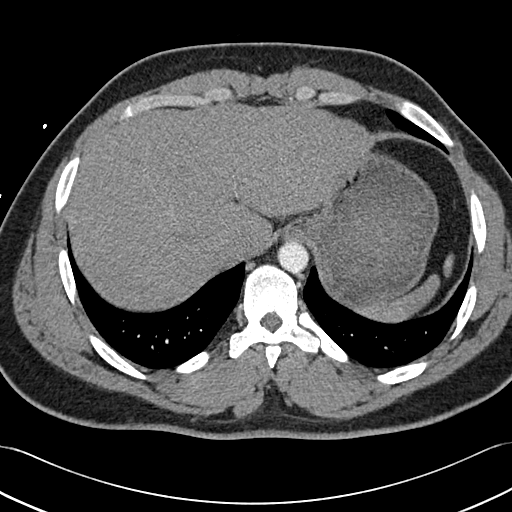
[im 82/258  lung]
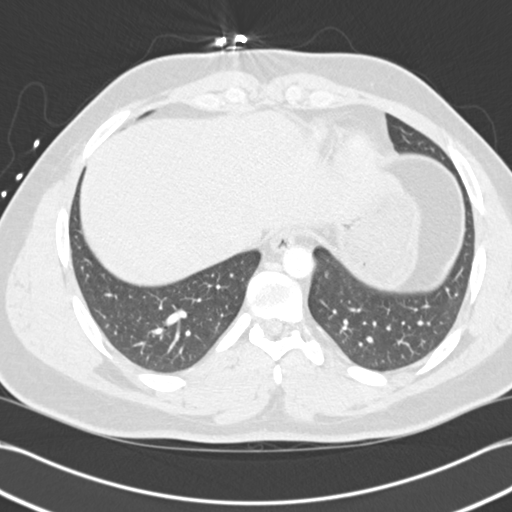
[im 95/258  soft-tissue]
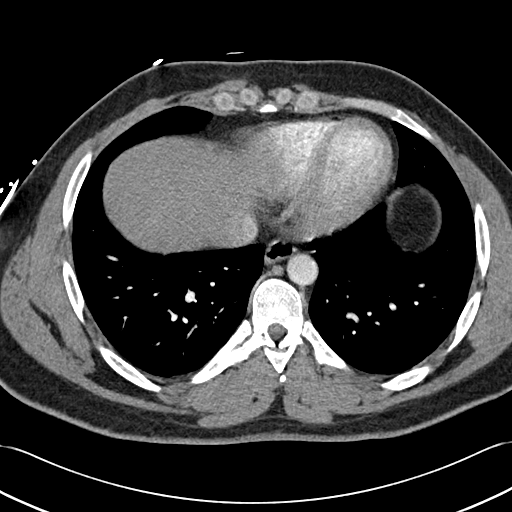
[im 109/258  lung]
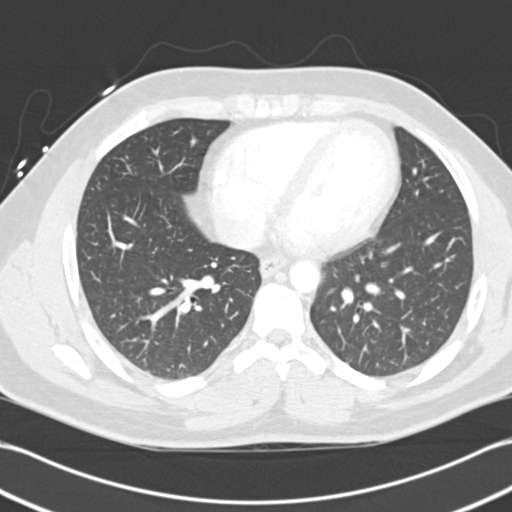
[im 136/258  soft-tissue]
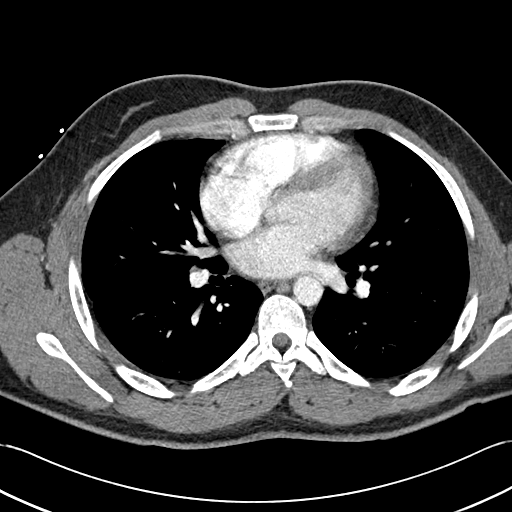
[im 149/258  lung]
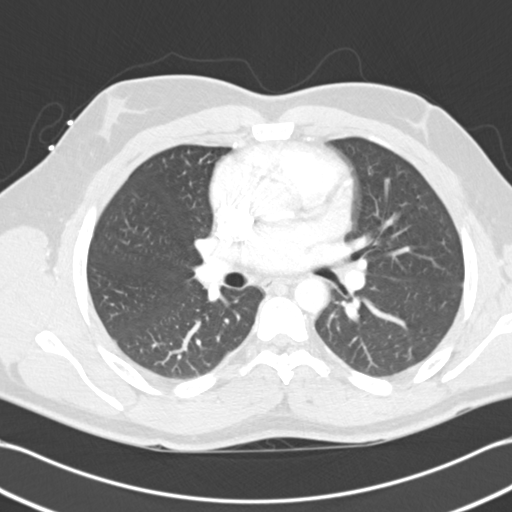
[im 163/258  soft-tissue]
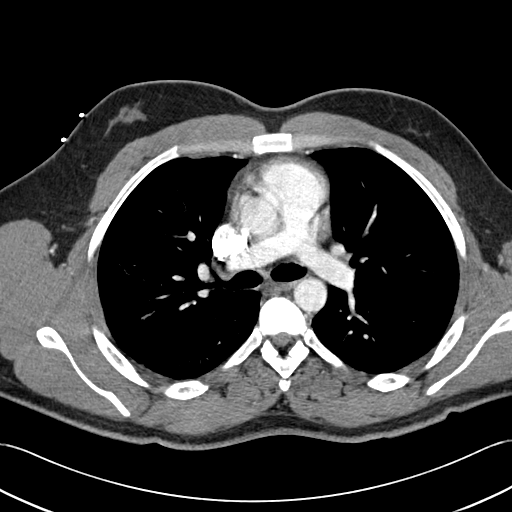
[im 176/258  lung]
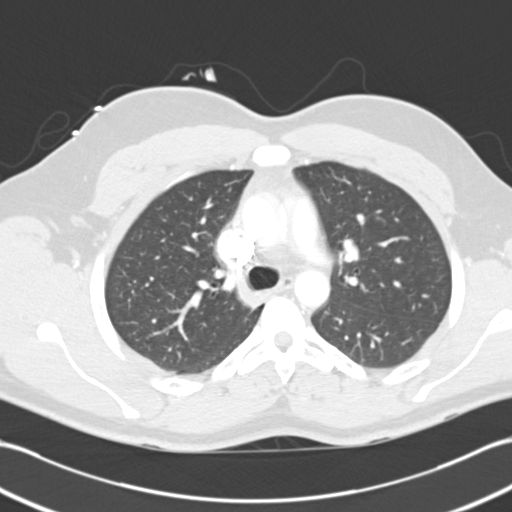
[im 190/258  soft-tissue]
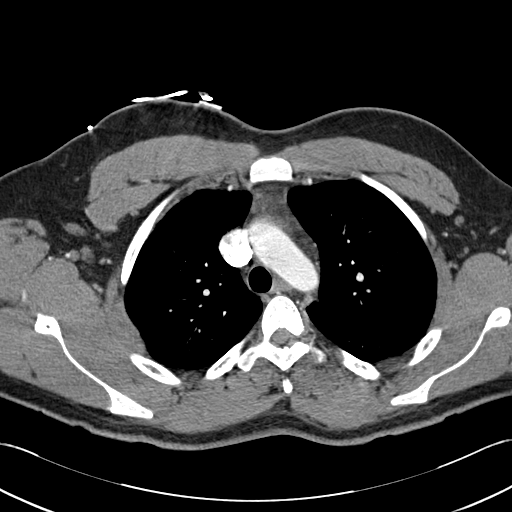
[im 217/258  lung]
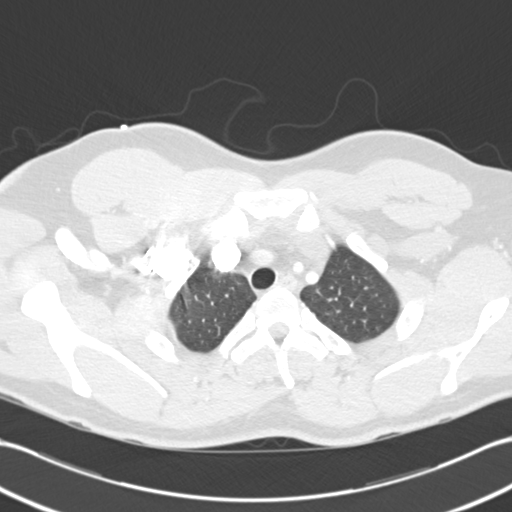
[im 230/258  soft-tissue]
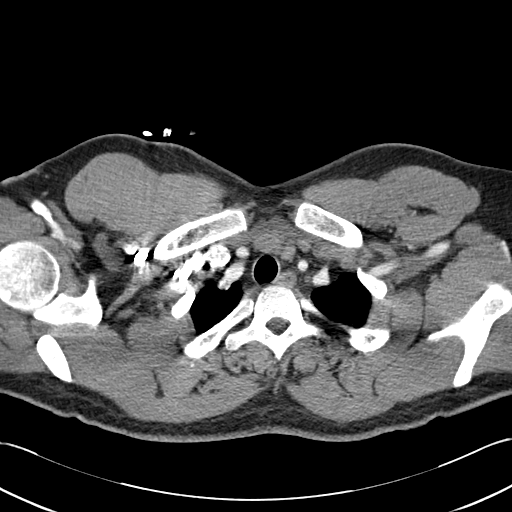
[im 244/258  lung]
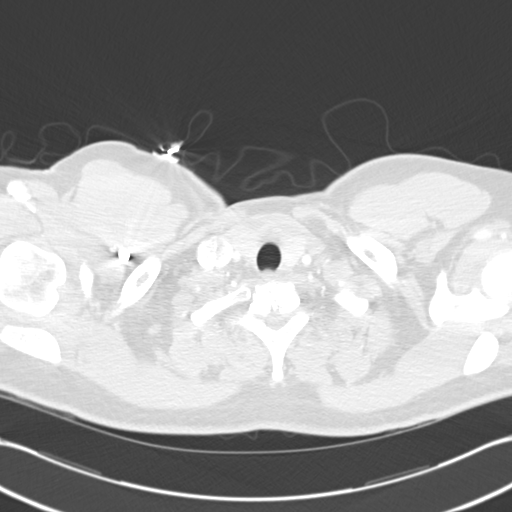

[Series 7: cor mpr 2.0 · coronal · 0.62mm/px · 3 of 143 slices shown]
[im 36/143  soft-tissue]
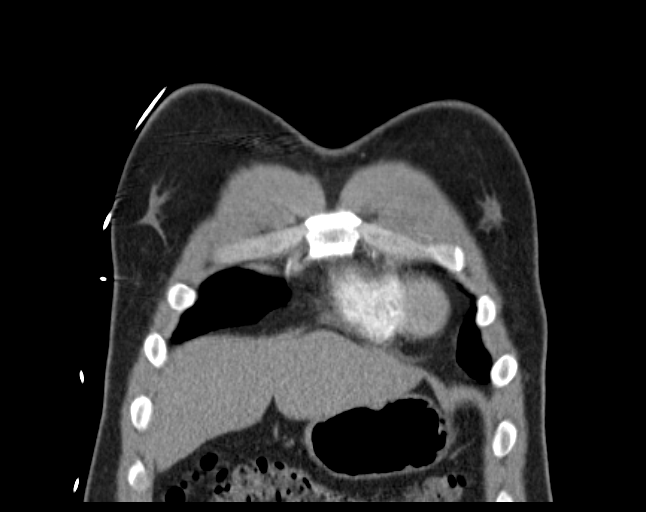
[im 72/143  soft-tissue]
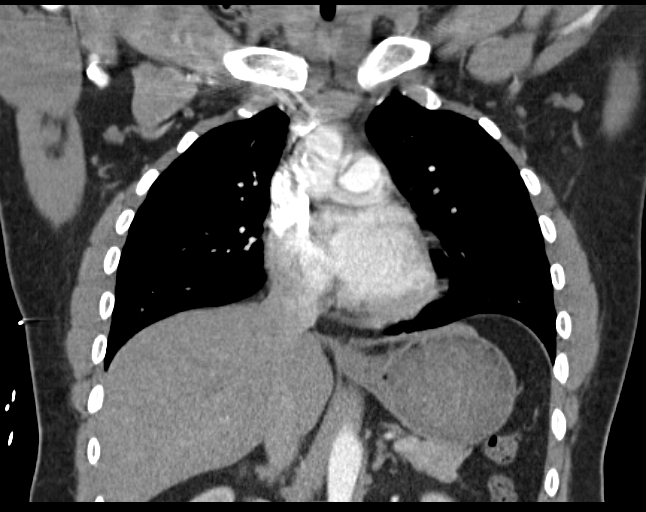
[im 107/143  soft-tissue]
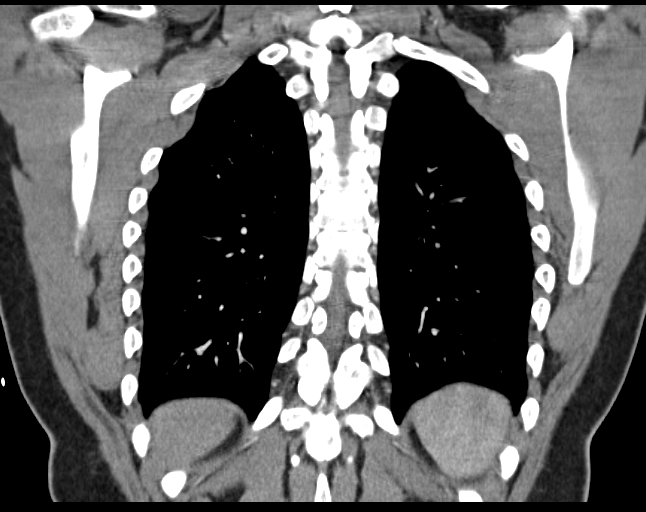

[18 of 46 positions shown; findings below may reference images not displayed]

FINDINGS: Mediastinum/Nodes: The study is high quality for the evaluation of
pulmonary embolism. There are no filling defects in the central,
lobar, segmental or subsegmental pulmonary artery branches to
suggest acute pulmonary embolism. Great vessels are normal in course
and caliber. No evidence of aortic dissection. Normal heart size. No
significant pericardial fluid/thickening. No discrete thyroid
nodules. Unremarkable esophagus. No pathologically enlarged
axillary, mediastinal or hilar lymph nodes.

Lungs/Pleura: No pneumothorax. No pleural effusion. No acute
consolidative airspace disease, significant pulmonary nodules or
lung masses.

Upper abdomen: Unremarkable.

Musculoskeletal: No aggressive appearing focal osseous lesions. Mild
gynecomastia asymmetric to the right.

Review of the MIP images confirms the above findings.
IMPRESSION: 1. No pulmonary embolism.
2. No acute pulmonary disease .
3. Mild gynecomastia asymmetric to the right.

## 2018-10-01 IMAGING — DX DG KNEE 1-2V*R*
2 series · 2 of 2 positions shown · non-contrast
Comparison: None.

CLINICAL DATA: Knee pain for 2 months

EXAM:
RIGHT KNEE - 1-2 VIEW

[knee ap]
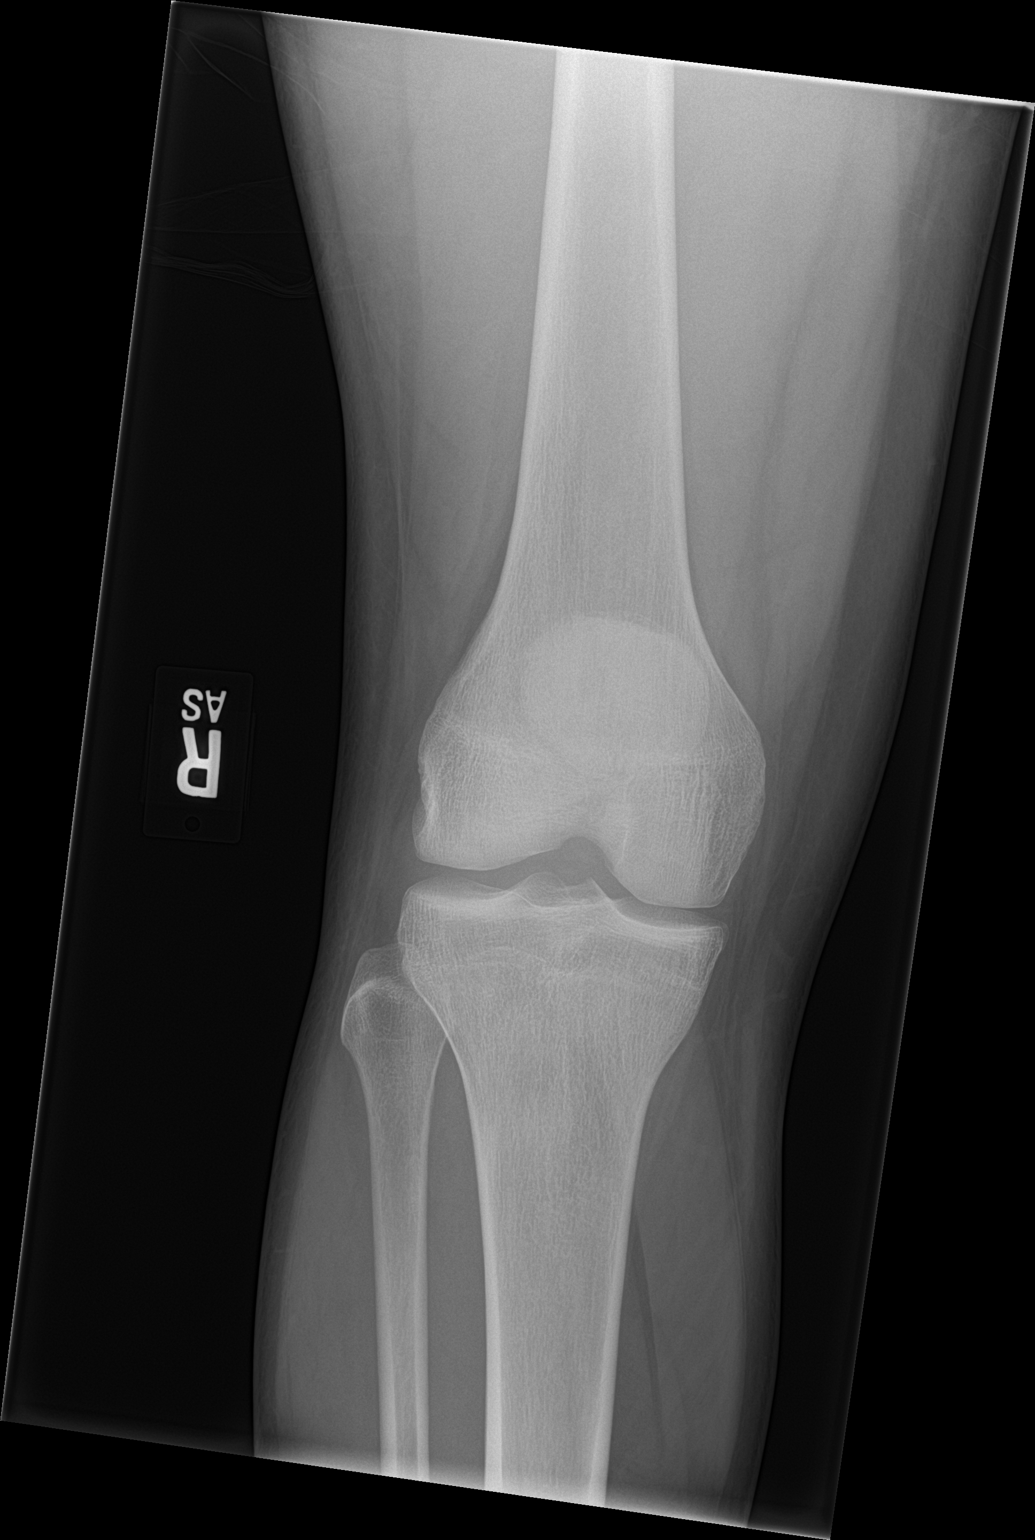

[knee lat]
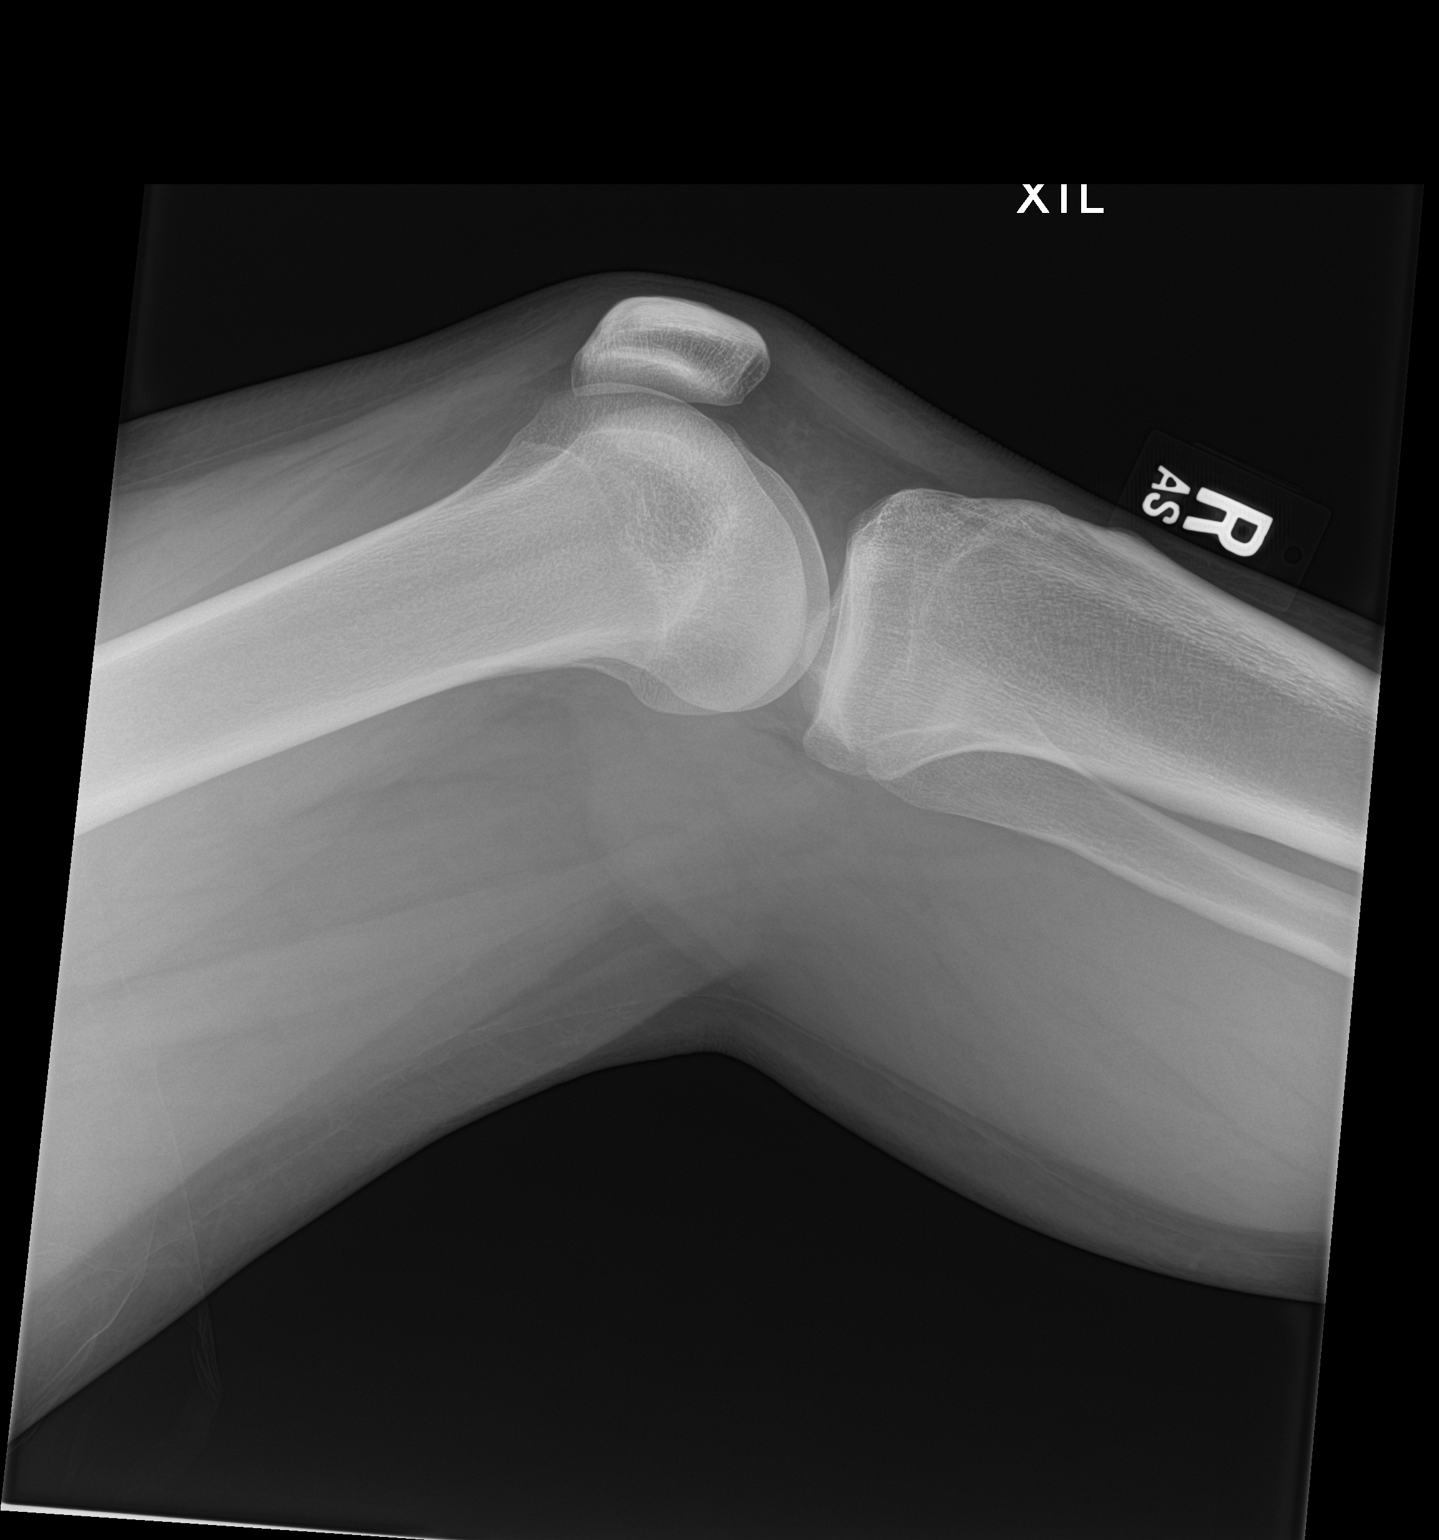

[2 of 2 positions shown; findings below may reference images not displayed]

FINDINGS: No evidence of fracture, dislocation, or joint effusion. No evidence
of arthropathy or other focal bone abnormality. Soft tissues are
unremarkable.
IMPRESSION: Negative.
